# Patient Record
Sex: Female | Born: 1995 | Hispanic: Yes | Marital: Single | Smoking: Never smoker
Health system: Southern US, Community
[De-identification: ages and names within clinical notes are randomized; demographics above are authoritative.]

## PROBLEM LIST (undated history)

## (undated) ENCOUNTER — Inpatient Hospital Stay: Payer: Self-pay

## (undated) DIAGNOSIS — Z789 Other specified health status: Secondary | ICD-10-CM

## (undated) HISTORY — PX: TONSILLECTOMY: SUR1361

---

## 2017-05-24 ENCOUNTER — Other Ambulatory Visit: Payer: Self-pay

## 2017-05-24 ENCOUNTER — Encounter: Payer: Self-pay | Admitting: *Deleted

## 2017-05-24 ENCOUNTER — Emergency Department
Admission: EM | Admit: 2017-05-24 | Discharge: 2017-05-25 | Disposition: A | Discharge status: Home / Self Care | Payer: Medicaid Other | Attending: Emergency Medicine | Admitting: Emergency Medicine

## 2017-05-24 DIAGNOSIS — I880 Nonspecific mesenteric lymphadenitis: Secondary | ICD-10-CM | POA: Diagnosis not present

## 2017-05-24 DIAGNOSIS — A749 Chlamydial infection, unspecified: Secondary | ICD-10-CM | POA: Insufficient documentation

## 2017-05-24 DIAGNOSIS — R109 Unspecified abdominal pain: Secondary | ICD-10-CM | POA: Diagnosis present

## 2017-05-24 DIAGNOSIS — N760 Acute vaginitis: Secondary | ICD-10-CM | POA: Insufficient documentation

## 2017-05-24 DIAGNOSIS — B9689 Other specified bacterial agents as the cause of diseases classified elsewhere: Secondary | ICD-10-CM | POA: Diagnosis not present

## 2017-05-24 LAB — CBC
HCT: 41.4 % (ref 35.0–47.0)
Hemoglobin: 13.7 g/dL (ref 12.0–16.0)
MCH: 28.9 pg (ref 26.0–34.0)
MCHC: 33.1 g/dL (ref 32.0–36.0)
MCV: 87.3 fL (ref 80.0–100.0)
PLATELETS: 282 10*3/uL (ref 150–440)
RBC: 4.74 MIL/uL (ref 3.80–5.20)
RDW: 15.6 % — AB (ref 11.5–14.5)
WBC: 10.2 10*3/uL (ref 3.6–11.0)

## 2017-05-24 LAB — COMPREHENSIVE METABOLIC PANEL
ALBUMIN: 4.6 g/dL (ref 3.5–5.0)
ALK PHOS: 80 U/L (ref 38–126)
ALT: 18 U/L (ref 14–54)
AST: 22 U/L (ref 15–41)
Anion gap: 11 (ref 5–15)
BILIRUBIN TOTAL: 0.2 mg/dL — AB (ref 0.3–1.2)
BUN: 13 mg/dL (ref 6–20)
CALCIUM: 9.7 mg/dL (ref 8.9–10.3)
CO2: 24 mmol/L (ref 22–32)
Chloride: 103 mmol/L (ref 101–111)
Creatinine, Ser: 0.67 mg/dL (ref 0.44–1.00)
GFR calc Af Amer: >60 mL/min (ref >60)
GLUCOSE: 107 mg/dL — AB (ref 65–99)
Potassium: 3.8 mmol/L (ref 3.5–5.1)
Sodium: 138 mmol/L (ref 135–145)
TOTAL PROTEIN: 8.4 g/dL — AB (ref 6.5–8.1)

## 2017-05-24 LAB — URINALYSIS, COMPLETE (UACMP) WITH MICROSCOPIC
BILIRUBIN URINE: NEGATIVE
Bacteria, UA: NONE SEEN
GLUCOSE, UA: NEGATIVE mg/dL
HGB URINE DIPSTICK: NEGATIVE
KETONES UR: NEGATIVE mg/dL
NITRITE: NEGATIVE
PH: 5 (ref 5.0–8.0)
Protein, ur: NEGATIVE mg/dL
SPECIFIC GRAVITY, URINE: 1.008 (ref 1.005–1.030)

## 2017-05-24 LAB — POCT PREGNANCY, URINE: Preg Test, Ur: NEGATIVE

## 2017-05-24 LAB — LIPASE, BLOOD: Lipase: 24 U/L (ref 11–51)

## 2017-05-24 MED ORDER — MORPHINE SULFATE (PF) 4 MG/ML IV SOLN
4.0000 mg | Freq: Once | INTRAVENOUS | Status: AC
  Administered 2017-05-24: 4 mg via INTRAVENOUS
  Filled 2017-05-24: qty 1

## 2017-05-24 MED ORDER — ONDANSETRON HCL 4 MG/2ML IJ SOLN
4.0000 mg | Freq: Once | INTRAMUSCULAR | Status: AC
  Administered 2017-05-24: 4 mg via INTRAVENOUS
  Filled 2017-05-24: qty 2

## 2017-05-24 MED ORDER — SODIUM CHLORIDE 0.9 % IV BOLUS (SEPSIS)
1000.0000 mL | Freq: Once | INTRAVENOUS | Status: AC
  Administered 2017-05-24: 1000 mL via INTRAVENOUS

## 2017-05-24 MED ORDER — KETOROLAC TROMETHAMINE 30 MG/ML IJ SOLN
15.0000 mg | Freq: Once | INTRAMUSCULAR | Status: AC
  Administered 2017-05-24: 15 mg via INTRAVENOUS
  Filled 2017-05-24: qty 1

## 2017-05-24 NOTE — ED Triage Notes (Signed)
Pt to ED reporting sudden onset of lower and pain today with nausea and vomiting. Pt reports vaginal pain. No bleeding at this time. No diarrhea or fevers reported.

## 2017-05-24 NOTE — ED Provider Notes (Signed)
[facility] Emergency Department Provider Note  ____________________________________________   First MD Initiated Contact with Patient 05/24/17 2328     (approximate)  I have reviewed the triage vital signs and the nursing notes.   HISTORY  Chief Complaint Abdominal Pain   HPI Shelby Chavez is a 21 y.o. female who self presents to the emergency department with severe sudden onset lower abdominal pain nausea and vomiting that began several hours prior to arrival.  Pain began suddenly and has been constant ever since.  Nothing makes it better or worse.  She is also noted 3 days of a "bump" inside her vagina.  No fevers or chills.  No back pain.  No dysuria frequency or hesitancy.  She is sexually monogamous with her husband.  History reviewed. No pertinent past medical history.  There are no active problems to display for this patient.   History reviewed. No pertinent surgical history.  Prior to Admission medications   Medication Sig Start Date End Date Taking? Authorizing Provider  metroNIDAZOLE (FLAGYL) 500 MG tablet Take 1 tablet (500 mg total) 2 (two) times daily for 7 days by mouth. 05/25/17 06/01/17  Merrily Brittleifenbark, Stephaine Breshears, MD    Allergies Patient has no allergy information on record.  History reviewed. No pertinent family history.  Social History Social History   Tobacco Use  . Smoking status: Never Smoker  . Smokeless tobacco: Never Used  Substance Use Topics  . Alcohol use: No    Frequency: Never  . Drug use: Not on file    Review of Systems Constitutional: No fever/chills Eyes: No visual changes. ENT: No sore throat. Cardiovascular: Denies chest pain. Respiratory: Denies shortness of breath. Gastrointestinal: Positive for abdominal pain.  Positive for nausea, positive for vomiting.  No diarrhea.  No constipation. Genitourinary: Negative for dysuria. Musculoskeletal: Negative for back pain. Skin: Negative for rash. Neurological:  Negative for headaches, focal weakness or numbness.   ____________________________________________   PHYSICAL EXAM:  VITAL SIGNS: ED Triage Vitals [05/24/17 2127]  Enc Vitals Group     BP 133/82     Pulse Rate 99     Resp 18     Temp 98.9 F (37.2 C)     Temp Source Oral     SpO2 100 %     Weight 170 lb (77.1 kg)     Height 5\' 8"  (1.727 m)     Head Circumference      Peak Flow      Pain Score 8     Pain Loc      Pain Edu?      Excl. in GC?     Constitutional: Alert and oriented x4 appears somewhat uncomfortable nontoxic no diaphoresis speaks in full clear sentences Eyes: PERRL EOMI. Head: Atraumatic. Nose: No congestion/rhinnorhea. Mouth/Throat: No trismus Neck: No stridor.   Cardiovascular: Normal rate, regular rhythm. Grossly normal heart sounds.  Good peripheral circulation. Respiratory: Normal respiratory effort.  No retractions. Lungs CTAB and moving good air Gastrointestinal: Soft nondistended she is tender in her right greater than left lower quadrants with tenderness over McBurney's although no rebound or guarding and no frank peritonitis Pelvic exam chaperoned by female nurse Sherie: Normal external exam.  Copious amounts of green discharge coming from cervical eyes.  No cervical motion tenderness or adnexal tenderness Musculoskeletal: No lower extremity edema   Neurologic:  Normal speech and language. No gross focal neurologic deficits are appreciated. Skin:  Skin is warm, dry and intact. No rash noted. Psychiatric:  Mood and affect are normal. Speech and behavior are normal.    ____________________________________________   DIFFERENTIAL includes but not limited to  Appendicitis, diverticulitis, pelvic inflammatory disease, urinary tract infection ____________________________________________   LABS (all labs ordered are listed, but only abnormal results are displayed)  Labs Reviewed  CHLAMYDIA/NGC RT PCR ([facility] ONLY) - Abnormal; Notable for  the following components:      Result Value   Chlamydia Tr DETECTED (*)    All other components within normal limits  WET PREP, GENITAL - Abnormal; Notable for the following components:   Clue Cells Wet Prep HPF POC PRESENT (*)    WBC, Wet Prep HPF POC MANY (*)    All other components within normal limits  COMPREHENSIVE METABOLIC PANEL - Abnormal; Notable for the following components:   Glucose, Bld 107 (*)    Total Protein 8.4 (*)    Total Bilirubin 0.2 (*)    All other components within normal limits  CBC - Abnormal; Notable for the following components:   RDW 15.6 (*)    All other components within normal limits  URINALYSIS, COMPLETE (UACMP) WITH MICROSCOPIC - Abnormal; Notable for the following components:   Color, Urine STRAW (*)    APPearance HAZY (*)    Leukocytes, UA MODERATE (*)    Squamous Epithelial / LPF 0-5 (*)    All other components within normal limits  LIPASE, BLOOD  POCT PREGNANCY, URINE    Blood work reviewed and interpreted by me is positive for chlamydia as well as clue cells concerning for bacterial vaginosis __________________________________________  EKG   ____________________________________________  RADIOLOGY  CT scan abdomen pelvis reviewed by me shows nonspecific swelling in the mesentery with normal appendix. ____________________________________________   PROCEDURES  Procedure(s) performed: no  Procedures  Critical Care performed: no  Observation: no ____________________________________________   INITIAL IMPRESSION / ASSESSMENT AND PLAN / ED COURSE  Pertinent labs & imaging results that were available during my care of the patient were reviewed by me and considered in my medical decision making (see chart for details).  On arrival the patient is somewhat uncomfortable appearing with a tender abdomen.  She also reports a "ball" in her vagina for the past several days.  I think these are likely unrelated she very well may have a  Bartholin cyst versus abscess in addition to intra-abdominal pathology.  CT scan is pending.     ----------------------------------------- 2:51 AM on 05/25/2017 -----------------------------------------  The patient's abdominal pain is improved and she is able to eat and drink.  CT scan is negative for acute infectious etiology and she appears to have mesenteric adenitis.  Her pelvic exam was positive for bacterial vaginosis will treat her with Flagyl for 1 week.  GC CT is pending.  Offered the patient presumptive treatment for gonorrhea and chlamydia now versus call back with the results of her testing and she opted to be call back in the morning.  ----------------------------------------- 6:43 AM on 05/25/2017 -----------------------------------------  The patient's chlamydia test is back positive.  I have personally called the patient back twice however received no answer and she is not set up her voicemail box.  He did pass on the information to charge nurse in the morning to have her call back. ____________________________________________   FINAL CLINICAL IMPRESSION(S) / ED DIAGNOSES  Final diagnoses:  Mesenteric adenitis  Bacterial vaginosis  Chlamydia      NEW MEDICATIONS STARTED DURING THIS VISIT:  This SmartLink is deprecated. Use AVSMEDLIST instead to display the medication list  for a patient.   Note:  This document was prepared using Dragon voice recognition software and may include unintentional dictation errors.     Merrily Brittleifenbark, Cheng Dec, MD 05/25/17 [phone]

## 2017-05-25 ENCOUNTER — Encounter: Payer: Self-pay | Admitting: Radiology

## 2017-05-25 ENCOUNTER — Emergency Department: Payer: Medicaid Other

## 2017-05-25 LAB — WET PREP, GENITAL
Sperm: NONE SEEN
TRICH WET PREP: NONE SEEN
YEAST WET PREP: NONE SEEN

## 2017-05-25 LAB — CHLAMYDIA/NGC RT PCR (ARMC ONLY)
CHLAMYDIA TR: DETECTED — AB
N gonorrhoeae: NOT DETECTED

## 2017-05-25 MED ORDER — IOPAMIDOL (ISOVUE-300) INJECTION 61%
100.0000 mL | Freq: Once | INTRAVENOUS | Status: AC | PRN
  Administered 2017-05-25: 100 mL via INTRAVENOUS

## 2017-05-25 MED ORDER — METRONIDAZOLE 500 MG PO TABS: 500.0000 mg | ORAL_TABLET | Freq: Two times a day (BID) | ORAL | 0 refills | Status: AC

## 2017-05-25 NOTE — ED Notes (Signed)
Patient transported to CT 

## 2017-05-25 NOTE — ED Notes (Signed)
Pt back from CT

## 2017-05-25 NOTE — Discharge Instructions (Signed)
Please take all of your antibiotics as prescribed and follow-up with primary care as needed for recheck.  Return to the emergency department sooner for any concerns.  It was a pleasure to take care of you today, and thank you for coming to our emergency department.  If you have any questions or concerns before leaving please ask the nurse to grab me and I'm more than happy to go through your aftercare instructions again.  If you were prescribed any opioid pain medication today such as Norco, Vicodin, Percocet, morphine, hydrocodone, or oxycodone please make sure you do not drive when you are taking this medication as it can alter your ability to drive safely.  If you have any concerns once you are home that you are not improving or are in fact getting worse before you can make it to your follow-up appointment, please do not hesitate to call 911 and come back for further evaluation.  Merrily BrittleNeil Alvena Kiernan, MD  Results for orders placed or performed during the hospital encounter of 05/24/17  Wet prep, genital  Result Value Ref Range   Yeast Wet Prep HPF POC NONE SEEN NONE SEEN   Trich, Wet Prep NONE SEEN NONE SEEN   Clue Cells Wet Prep HPF POC PRESENT (A) NONE SEEN   WBC, Wet Prep HPF POC MANY (A) NONE SEEN   Sperm NONE SEEN   Lipase, blood  Result Value Ref Range   Lipase 24 11 - 51 U/L  Comprehensive metabolic panel  Result Value Ref Range   Sodium 138 135 - 145 mmol/L   Potassium 3.8 3.5 - 5.1 mmol/L   Chloride 103 101 - 111 mmol/L   CO2 24 22 - 32 mmol/L   Glucose, Bld 107 (H) 65 - 99 mg/dL   BUN 13 6 - 20 mg/dL   Creatinine, Ser 4.090.67 0.44 - 1.00 mg/dL   Calcium 9.7 8.9 - 81.110.3 mg/dL   Total Protein 8.4 (H) 6.5 - 8.1 g/dL   Albumin 4.6 3.5 - 5.0 g/dL   AST 22 15 - 41 U/L   ALT 18 14 - 54 U/L   Alkaline Phosphatase 80 38 - 126 U/L   Total Bilirubin 0.2 (L) 0.3 - 1.2 mg/dL   GFR calc non Af Amer >60 >60 mL/min   GFR calc Af Amer >60 >60 mL/min   Anion gap 11 5 - 15  CBC  Result Value  Ref Range   WBC 10.2 3.6 - 11.0 K/uL   RBC 4.74 3.80 - 5.20 MIL/uL   Hemoglobin 13.7 12.0 - 16.0 g/dL   HCT 91.441.4 78.235.0 - 95.647.0 %   MCV 87.3 80.0 - 100.0 fL   MCH 28.9 26.0 - 34.0 pg   MCHC 33.1 32.0 - 36.0 g/dL   RDW 21.315.6 (H) 08.611.5 - 57.814.5 %   Platelets 282 150 - 440 K/uL  Urinalysis, Complete w Microscopic  Result Value Ref Range   Color, Urine STRAW (A) YELLOW   APPearance HAZY (A) CLEAR   Specific Gravity, Urine 1.008 1.005 - 1.030   pH 5.0 5.0 - 8.0   Glucose, UA NEGATIVE NEGATIVE mg/dL   Hgb urine dipstick NEGATIVE NEGATIVE   Bilirubin Urine NEGATIVE NEGATIVE   Ketones, ur NEGATIVE NEGATIVE mg/dL   Protein, ur NEGATIVE NEGATIVE mg/dL   Nitrite NEGATIVE NEGATIVE   Leukocytes, UA MODERATE (A) NEGATIVE   RBC / HPF 6-30 0 - 5 RBC/hpf   WBC, UA 0-5 0 - 5 WBC/hpf   Bacteria, UA NONE SEEN NONE SEEN  Squamous Epithelial / LPF 0-5 (A) NONE SEEN  Pregnancy, urine POC  Result Value Ref Range   Preg Test, Ur NEGATIVE NEGATIVE   Ct Abdomen Pelvis W Contrast  Result Date: 05/25/2017 CLINICAL DATA:  Abdominal pain with nausea and vomiting EXAM: CT ABDOMEN AND PELVIS WITH CONTRAST TECHNIQUE: Multidetector CT imaging of the abdomen and pelvis was performed using the standard protocol following bolus administration of intravenous contrast. CONTRAST:  100mL ISOVUE-300 IOPAMIDOL (ISOVUE-300) INJECTION 61% COMPARISON:  None. FINDINGS: Lower chest: Lung bases demonstrate no acute consolidation or effusion. Normal heart size. Hepatobiliary: No focal liver abnormality is seen. No gallstones, gallbladder wall thickening, or biliary dilatation. Pancreas: Unremarkable. No pancreatic ductal dilatation or surrounding inflammatory changes. Spleen: Normal in size without focal abnormality. Adrenals/Urinary Tract: Adrenal glands are unremarkable. Kidneys are normal, without renal calculi, focal lesion, or hydronephrosis. Bladder is unremarkable. Stomach/Bowel: Stomach is within normal limits. Appendix appears  normal. No evidence of bowel wall thickening, distention, or inflammatory changes. Vascular/Lymphatic: No significant vascular findings are present. No enlarged abdominal or pelvic lymph nodes. Reproductive: Uterus and bilateral adnexa are unremarkable. Other: Small free fluid in the pelvis. No free air. Increased hazy density within the anterior mesentry. Musculoskeletal: No acute or significant osseous findings. IMPRESSION: 1. Mild hazy density within the anterior mesenteric fat, this is of uncertain significance and etiology and could relate to nonspecific mesenteric edema or inflammation. No adjacent colon wall thickening. Negative for appendicitis. 2. Small amount of free fluid in the pelvis. Electronically Signed   By: Jasmine PangKim  Fujinaga M.D.   On: 05/25/2017 00:57

## 2017-05-27 ENCOUNTER — Telehealth: Payer: Self-pay | Admitting: Emergency Medicine

## 2017-05-27 NOTE — Telephone Encounter (Addendum)
Called patient to inform of positive chlamydia test and need for treatment.  Per dr Lamont Snowballrifenbark call in doxycycline 100 mg twice daily for 10 days.  Phone is not taking calls right now.  I have been unable to reach patient.  Her phone continues to say it is not taking calls.  I will send a letter asking patient to call me.

## 2017-06-12 ENCOUNTER — Other Ambulatory Visit: Payer: Self-pay | Admitting: Family Medicine

## 2017-06-12 DIAGNOSIS — Z3481 Encounter for supervision of other normal pregnancy, first trimester: Secondary | ICD-10-CM

## 2017-06-12 DIAGNOSIS — Z30431 Encounter for routine checking of intrauterine contraceptive device: Secondary | ICD-10-CM

## 2017-06-17 ENCOUNTER — Ambulatory Visit
Admission: RE | Admit: 2017-06-17 | Discharge: 2017-06-17 | Disposition: A | Discharge status: Home / Self Care | Payer: Medicaid Other | Source: Ambulatory Visit | Attending: Family Medicine | Admitting: Family Medicine

## 2017-06-17 DIAGNOSIS — Z30431 Encounter for routine checking of intrauterine contraceptive device: Secondary | ICD-10-CM | POA: Insufficient documentation

## 2017-06-17 DIAGNOSIS — Z3481 Encounter for supervision of other normal pregnancy, first trimester: Secondary | ICD-10-CM

## 2017-06-17 DIAGNOSIS — O4691 Antepartum hemorrhage, unspecified, first trimester: Secondary | ICD-10-CM | POA: Diagnosis not present

## 2017-06-17 DIAGNOSIS — Z3A01 Less than 8 weeks gestation of pregnancy: Secondary | ICD-10-CM | POA: Diagnosis not present

## 2017-07-25 ENCOUNTER — Emergency Department
Admission: EM | Admit: 2017-07-25 | Discharge: 2017-07-26 | Disposition: A | Discharge status: Home / Self Care | Payer: Medicaid Other | Attending: Emergency Medicine | Admitting: Emergency Medicine

## 2017-07-25 ENCOUNTER — Other Ambulatory Visit: Payer: Self-pay

## 2017-07-25 ENCOUNTER — Encounter: Payer: Self-pay | Admitting: Emergency Medicine

## 2017-07-25 DIAGNOSIS — R102 Pelvic and perineal pain: Secondary | ICD-10-CM | POA: Diagnosis not present

## 2017-07-25 DIAGNOSIS — O99341 Other mental disorders complicating pregnancy, first trimester: Secondary | ICD-10-CM | POA: Diagnosis not present

## 2017-07-25 DIAGNOSIS — E876 Hypokalemia: Secondary | ICD-10-CM | POA: Diagnosis not present

## 2017-07-25 DIAGNOSIS — Z3A11 11 weeks gestation of pregnancy: Secondary | ICD-10-CM | POA: Insufficient documentation

## 2017-07-25 DIAGNOSIS — O9989 Other specified diseases and conditions complicating pregnancy, childbirth and the puerperium: Secondary | ICD-10-CM | POA: Diagnosis present

## 2017-07-25 DIAGNOSIS — Z3490 Encounter for supervision of normal pregnancy, unspecified, unspecified trimester: Secondary | ICD-10-CM

## 2017-07-25 DIAGNOSIS — O99281 Endocrine, nutritional and metabolic diseases complicating pregnancy, first trimester: Secondary | ICD-10-CM | POA: Diagnosis not present

## 2017-07-25 DIAGNOSIS — F419 Anxiety disorder, unspecified: Secondary | ICD-10-CM

## 2017-07-25 DIAGNOSIS — F410 Panic disorder [episodic paroxysmal anxiety] without agoraphobia: Secondary | ICD-10-CM

## 2017-07-25 LAB — COMPREHENSIVE METABOLIC PANEL
ALK PHOS: 71 U/L (ref 38–126)
ALT: 12 U/L — ABNORMAL LOW (ref 14–54)
ANION GAP: 9 (ref 5–15)
AST: 21 U/L (ref 15–41)
Albumin: 3.8 g/dL (ref 3.5–5.0)
BUN: 6 mg/dL (ref 6–20)
CALCIUM: 8.7 mg/dL — AB (ref 8.9–10.3)
CO2: 24 mmol/L (ref 22–32)
Chloride: 102 mmol/L (ref 101–111)
Creatinine, Ser: 0.56 mg/dL (ref 0.44–1.00)
Glucose, Bld: 125 mg/dL — ABNORMAL HIGH (ref 65–99)
Potassium: 3.3 mmol/L — ABNORMAL LOW (ref 3.5–5.1)
Sodium: 135 mmol/L (ref 135–145)
TOTAL PROTEIN: 7.2 g/dL (ref 6.5–8.1)
Total Bilirubin: <0.1 mg/dL — ABNORMAL LOW (ref 0.3–1.2)

## 2017-07-25 LAB — CBC
HCT: 38.6 % (ref 35.0–47.0)
HEMOGLOBIN: 13.2 g/dL (ref 12.0–16.0)
MCH: 29 pg (ref 26.0–34.0)
MCHC: 34.3 g/dL (ref 32.0–36.0)
MCV: 84.8 fL (ref 80.0–100.0)
PLATELETS: 233 10*3/uL (ref 150–440)
RBC: 4.55 MIL/uL (ref 3.80–5.20)
RDW: 13 % (ref 11.5–14.5)
WBC: 10.4 10*3/uL (ref 3.6–11.0)

## 2017-07-25 LAB — LIPASE, BLOOD: Lipase: 25 U/L (ref 11–51)

## 2017-07-25 LAB — URINALYSIS, COMPLETE (UACMP) WITH MICROSCOPIC
BILIRUBIN URINE: NEGATIVE
GLUCOSE, UA: NEGATIVE mg/dL
HGB URINE DIPSTICK: NEGATIVE
KETONES UR: NEGATIVE mg/dL
LEUKOCYTES UA: NEGATIVE
NITRITE: NEGATIVE
PROTEIN: NEGATIVE mg/dL
Specific Gravity, Urine: 1.015 (ref 1.005–1.030)
pH: 7 (ref 5.0–8.0)

## 2017-07-25 LAB — HCG, QUANTITATIVE, PREGNANCY: HCG, BETA CHAIN, QUANT, S: 38291 m[IU]/mL — AB (ref <5)

## 2017-07-25 MED ORDER — POTASSIUM CHLORIDE CRYS ER 20 MEQ PO TBCR
40.0000 meq | EXTENDED_RELEASE_TABLET | Freq: Once | ORAL | Status: AC
  Administered 2017-07-26: 40 meq via ORAL
  Filled 2017-07-25: qty 2

## 2017-07-25 NOTE — ED Provider Notes (Signed)
[facility] Emergency Department Provider Note  ____________________________________________   First MD Initiated Contact with Patient 07/25/17 2310     (approximate)  I have reviewed the triage vital signs and the nursing notes.   HISTORY  Chief Complaint Abdominal Cramping; Anxiety; and Shortness of Breath    HPI Shelby Chavez is a 22 y.o. female G4P3 at about [redacted] weeks gestation who goes to [facility] for [facility].  She presents by private vehicle for evaluation of increased anxiety and what she describes as a panic attack tonight.  She states that tonight she felt increasingly anxious about her pregnancy and worried in general.  She had an episode where she began breathing rapidly and had some generalized cramping as well as some cramping in her lower abdomen.  The episode lasted about 30 minutes and she has been feeling much better for the 4-1/2 hours she has been waiting in the emergency department.  He was ill last week and states that she tested positive for fluids of but she is feeling much better now.  Since being treated, she denies any cough, fever/chills, nausea//vomiting.  She has had no vaginal bleeding and no dysuria.  She had shortness of breath associated with the hyperventilation and some generalized muscle aches as well as the cramping in the left lower quadrant of her abdomen.  The episode was severe but the residual cramping is mild.  The episode was acute in onset.  She has no history of anxiety or panic attacks previously.  History reviewed. No pertinent past medical history.  There are no active problems to display for this patient.   No past surgical history on file.  Prior to Admission medications   Not on File    Allergies Patient has no known allergies.  No family history on file.  Social History Social History   Tobacco Use  . Smoking status: Never Smoker  . Smokeless tobacco: Never Used  Substance Use Topics    . Alcohol use: No    Frequency: Never  . Drug use: No    Review of Systems Constitutional: No fever/chills Eyes: No visual changes. ENT: No sore throat. Cardiovascular: Denies chest pain. Respiratory: Episode of shortness of breath and rapid breathing (hyperventilation), now completely resolved. Gastrointestinal: Left lower quadrant abdominal cramping.  No nausea, no vomiting.  No diarrhea.  No constipation. Genitourinary: Negative for dysuria. Musculoskeletal: Negative for neck pain.  Negative for back pain. Integumentary: Negative for rash. Neurological: Negative for headaches, focal weakness or numbness. Psych: Increased anxiety and a probable panic attack today  ____________________________________________   PHYSICAL EXAM:  VITAL SIGNS: ED Triage Vitals  Enc Vitals Group     BP 07/25/17 1822 (!) 131/55     Pulse Rate 07/25/17 1822 91     Resp 07/25/17 1822 18     Temp 07/25/17 1822 98.2 F (36.8 C)     Temp Source 07/25/17 1822 Oral     SpO2 07/25/17 1822 100 %     Weight 07/25/17 1822 83.5 kg (184 lb)     Height 07/25/17 1822 1.67 m (5' 5.75")     Head Circumference --      Peak Flow --      Pain Score 07/25/17 1821 5     Pain Loc --      Pain Edu? --      Excl. in GC? --     Constitutional: Alert and oriented. Well appearing and in no acute distress. Eyes:  Conjunctivae are normal.  Head: Atraumatic. Nose: No congestion/rhinnorhea. Mouth/Throat: Mucous membranes are moist. Neck: No stridor.  No meningeal signs.   Cardiovascular: Normal rate, regular rhythm. Good peripheral circulation. Grossly normal heart sounds. Respiratory: Normal respiratory effort.  No retractions. Lungs CTAB. Gastrointestinal: Soft and nontender. No distention.  Genitourinary: Deferred, no indication for pelvic exam Musculoskeletal: No lower extremity tenderness nor edema. No gross deformities of extremities. Neurologic:  Normal speech and language. No gross focal neurologic deficits  are appreciated.  Skin:  Skin is warm, dry and intact. No rash noted. Psychiatric: Mood and affect are slightly anxious, but generally normal and appropriate.  ____________________________________________   LABS (all labs ordered are listed, but only abnormal results are displayed)  Labs Reviewed  COMPREHENSIVE METABOLIC PANEL - Abnormal; Notable for the following components:      Result Value   Potassium 3.3 (*)    Glucose, Bld 125 (*)    Calcium 8.7 (*)    ALT 12 (*)    Total Bilirubin <0.1 (*)    All other components within normal limits  URINALYSIS, COMPLETE (UACMP) WITH MICROSCOPIC - Abnormal; Notable for the following components:   Squamous Epithelial / LPF 0-5 (*)    Bacteria, UA RARE (*)    All other components within normal limits  HCG, QUANTITATIVE, PREGNANCY - Abnormal; Notable for the following components:   hCG, Beta Chain, Quant, S 38,291 (*)    All other components within normal limits  LIPASE, BLOOD  CBC   ____________________________________________  EKG  ED ECG REPORT I, Loleta Rose, the attending physician, personally viewed and interpreted this ECG.  Date: 07/25/2017 EKG Time: 18:27 Rate: 95 Rhythm: normal sinus rhythm QRS Axis: normal Intervals: normal ST/T Wave abnormalities: normal Narrative Interpretation: no evidence of acute ischemia  ____________________________________________  RADIOLOGY   No results found.  ____________________________________________   PROCEDURES  Critical Care performed: No   Procedure(s) performed:   Procedures   ____________________________________________   INITIAL IMPRESSION / ASSESSMENT AND PLAN / ED COURSE  As part of my medical decision making, I reviewed the following data within the electronic MEDICAL RECORD NUMBER Nursing notes reviewed and incorporated, Labs reviewed , EKG interpreted , Old chart reviewed and Notes from prior ED visits  Differential diagnosis includes but is not limited to  anxiety/panic attack, pulmonary embolism, bacterial infection/nonspecific infectious process, asthma attack, etc.   The patient is well-appearing in no acute distress with normal vital signs.  Her lab work is all reassuring except for a slightly low potassium.  I will give her a potassium supplement and some information about potassium rich foods but it is not clinically relevant at this time.  Her EKG is normal.  Urinalysis is still pending.  I performed a bedside transabdominal ultrasound and was able to visualize a fetus with normal cardiac activity.  I did not visualize any gross abnormalities that would necessitate an "official" ultrasound.  She feels better after her time in the emergency department, is currently asymptomatic except for some residual mild intermittent crampy pain in her left lower abdomen, and she is reassured by the bedside ultrasound.  We will await the results of the urinalysis but she is comfortable with following up with [facility].  I explained that typically I do not start people on benzodiazepines or other medication for anxiety in the emergency department and particularly not pregnant patients, and she is comfortable with following up as an outpatient.  Gave my usual and customary return precautions.  Clinical Course as  of Jul 26 212  Fri Jul 26, 2017  0002 UA reassuring with rare bacteria but 0-5 WBCs and clinically no signs/symptoms of UTI.  will not treat empirically.    [CF]    Clinical Course User Index [CF] Loleta RoseForbach, Jashad Depaula, MD    ____________________________________________  FINAL CLINICAL IMPRESSION(S) / ED DIAGNOSES  Final diagnoses:  Anxiety  Panic attack  Pregnancy at early stage  Hypokalemia     MEDICATIONS GIVEN DURING THIS VISIT:  Medications  potassium chloride SA (K-DUR,KLOR-CON) CR tablet 40 mEq (40 mEq Oral Given 07/26/17 0030)     ED Discharge Orders    None       Note:  This document was prepared using Dragon voice recognition  software and may include unintentional dictation errors.    Loleta RoseForbach, Tramar Brueckner, MD 07/26/17 [phone]

## 2017-07-25 NOTE — ED Triage Notes (Addendum)
Pt presents to ED, states she is [redacted] weeks pregnant. Pt states is a G4L3. Pt states that she has had increased anxiety at home that started earlier today. Pt states sudden onset sweating and "hard to breathe". Pt states had a panic attack. Pt states continued anxiety and shortness of breath at this time. Pt also c/o lower abdominal cramping, denies any vaginal bleeding at this time.

## 2017-07-25 NOTE — ED Notes (Signed)
Pt to the er for anxiety, panic attack. Pt says she was worried about her pregnancy and thinks maybe she was worried about the pregnancy. Pt reports some lower abdominal pain. Pt denies bleeding. VSS. Pt is in no distress. Dr York CeriseForbach at the bedside.

## 2017-07-25 NOTE — Discharge Instructions (Signed)
You have been seen in the Emergency Department (ED) today for a combination of symptoms that sound like anxiety and a panic attack.  As we have discussed today?s test results are normal, including a reassuring bedside ultrasound which showed your baby and a good strong heartbeat.  Please follow up with the recommended doctor as instructed above in these documents regarding today?s emergent visit and your recent symptoms to discuss further management.  Continue to take your regular medications including prenatal vitamins.  Read through the information provided about the potassium content of foods; your potassium level was a little low tonight, and even though that is not the cause of your symptoms, it makes sense to try and get a little more potassium in your diet.  Return to the Emergency Department (ED) if you experience any further chest pain/pressure/tightness, difficulty breathing, vaginal bleeding, or other symptoms that concern you.

## 2017-08-15 ENCOUNTER — Other Ambulatory Visit: Payer: Self-pay | Admitting: Family Medicine

## 2017-08-15 DIAGNOSIS — Z3481 Encounter for supervision of other normal pregnancy, first trimester: Secondary | ICD-10-CM

## 2017-09-17 ENCOUNTER — Ambulatory Visit
Admission: RE | Admit: 2017-09-17 | Discharge: 2017-09-17 | Disposition: A | Discharge status: Home / Self Care | Payer: Medicaid Other | Source: Ambulatory Visit | Attending: Family Medicine | Admitting: Family Medicine

## 2017-09-17 DIAGNOSIS — Z3482 Encounter for supervision of other normal pregnancy, second trimester: Secondary | ICD-10-CM | POA: Insufficient documentation

## 2017-09-17 DIAGNOSIS — Z3A19 19 weeks gestation of pregnancy: Secondary | ICD-10-CM | POA: Insufficient documentation

## 2017-09-17 DIAGNOSIS — Z3481 Encounter for supervision of other normal pregnancy, first trimester: Secondary | ICD-10-CM

## 2017-12-12 ENCOUNTER — Observation Stay
Admission: EM | Admit: 2017-12-12 | Discharge: 2017-12-13 | Disposition: A | Discharge status: Home / Self Care | Payer: Medicaid Other | Attending: Obstetrics and Gynecology | Admitting: Obstetrics and Gynecology

## 2017-12-12 ENCOUNTER — Other Ambulatory Visit: Payer: Self-pay

## 2017-12-12 DIAGNOSIS — O4703 False labor before 37 completed weeks of gestation, third trimester: Secondary | ICD-10-CM | POA: Diagnosis not present

## 2017-12-12 DIAGNOSIS — O26893 Other specified pregnancy related conditions, third trimester: Secondary | ICD-10-CM | POA: Insufficient documentation

## 2017-12-12 DIAGNOSIS — Z3A31 31 weeks gestation of pregnancy: Secondary | ICD-10-CM | POA: Diagnosis not present

## 2017-12-12 DIAGNOSIS — Z3490 Encounter for supervision of normal pregnancy, unspecified, unspecified trimester: Secondary | ICD-10-CM

## 2017-12-12 DIAGNOSIS — R102 Pelvic and perineal pain: Secondary | ICD-10-CM | POA: Diagnosis not present

## 2017-12-12 HISTORY — DX: Other specified health status: Z78.9

## 2017-12-12 NOTE — OB Triage Note (Signed)
Pt additionally states pelvic pressure.

## 2017-12-12 NOTE — OB Triage Note (Signed)
Pt arrival to triage with c/o losing her mucous plug around 2130.  Pt denies vaginal bleeding, LOF, and contractions.  Pt is feeling baby move.  EFM and toco applied and tracing.

## 2017-12-12 NOTE — Progress Notes (Signed)
At 2246 Interpreter on a stick used for triage assessment.  Jacquenette Shone #[id number] interpreted.

## 2017-12-13 DIAGNOSIS — O4703 False labor before 37 completed weeks of gestation, third trimester: Secondary | ICD-10-CM | POA: Diagnosis not present

## 2017-12-13 DIAGNOSIS — Z3490 Encounter for supervision of normal pregnancy, unspecified, unspecified trimester: Secondary | ICD-10-CM

## 2017-12-13 LAB — URINALYSIS, ROUTINE W REFLEX MICROSCOPIC
BILIRUBIN URINE: NEGATIVE
Glucose, UA: NEGATIVE mg/dL
Hgb urine dipstick: NEGATIVE
KETONES UR: NEGATIVE mg/dL
LEUKOCYTES UA: NEGATIVE
NITRITE: NEGATIVE
PROTEIN: NEGATIVE mg/dL
Specific Gravity, Urine: 1.004 — ABNORMAL LOW (ref 1.005–1.030)
pH: 7 (ref 5.0–8.0)

## 2017-12-13 LAB — FETAL FIBRONECTIN: FETAL FIBRONECTIN: POSITIVE — AB

## 2017-12-13 LAB — WET PREP, GENITAL: Trich, Wet Prep: NONE SEEN

## 2017-12-13 NOTE — Discharge Summary (Signed)
TRIAGE VISIT with NST   Chera A Gildner is a 22 y.o. [id number]. She is at [redacted]w[redacted]d gestation, presenting with concerns for preterm labor with losing her mucous plug and having pelvic pressure.  Indication: Contractions  S: Resting comfortably.  NO CTX, no VB. Active fetal movement. Concerned about pelvic pressure after intercourse.  O:  BP 124/72 (BP Location: Left Arm)   Pulse 93   Temp 98.1 F (36.7 C) (Oral)   Resp 16   Ht  (1.727 m)   Wt 89.8 kg (198 lb)   LMP 04/25/2017   Breastfeeding? Unknown   BMI 30.11 kg/m  Results for orders placed or performed during the hospital encounter of 12/12/17 (from the past 48 hour(s))  Wet prep, genital   Collection Time: 12/12/17 11:43 PM  Result Value Ref Range   Yeast Wet Prep HPF POC PRESENT (A) NONE SEEN   Trich, Wet Prep NONE SEEN NONE SEEN   Clue Cells Wet Prep HPF POC PRESENT (A) NONE SEEN   WBC, Wet Prep HPF POC FEW (A) NONE SEEN   Sperm PRESENT   Fetal fibronectin   Collection Time: 12/12/17 11:43 PM  Result Value Ref Range   Fetal Fibronectin POSITIVE (A) NEGATIVE   Appearance, FETFIB CLEAR CLEAR  Urinalysis, Routine w reflex microscopic   Collection Time: 12/12/17 11:43 PM  Result Value Ref Range   Color, Urine STRAW (A) YELLOW   APPearance CLEAR (A) CLEAR   Specific Gravity, Urine 1.004 (L) 1.005 - 1.030   pH 7.0 5.0 - 8.0   Glucose, UA NEGATIVE NEGATIVE mg/dL   Hgb urine dipstick NEGATIVE NEGATIVE   Bilirubin Urine NEGATIVE NEGATIVE   Ketones, ur NEGATIVE NEGATIVE mg/dL   Protein, ur NEGATIVE NEGATIVE mg/dL   Nitrite NEGATIVE NEGATIVE   Leukocytes, UA NEGATIVE NEGATIVE     Gen: NAD, AAOx3      Abd: FNTTP      Ext: Non-tender, Nonedmeatous    NST/FHT: 125, mod var, +accels, no decels TOCO: quiet [id number]: Fingertip Exam by:: Charlane Ferretti RN  NST: Reactive. See FHT above for particulars.  A/P:  22 y.o. [id number] [redacted]w[redacted]d with concern for early labor.   Labor: not present. Cervix not dilated and no contractions  on the monitor. No evidence of UTI.  Pelvic pressure: Positive wet prep for yeast and clue cells. Will d/c home with diflucan and recommendation for metrogel tomorrow at her visit. PO hydration.   Fetal Wellbeing: NST reactive Reassuring Cat 1 tracing.  D/c home stable, precautions reviewed, follow-up as scheduled.

## 2017-12-13 NOTE — OB Triage Note (Signed)
Discharge instructions provided and reviewed.  Discussed plan to call [facility] for metronidazole prescription and to obtain over the counter monistat.  Pt verbalized understanding.

## 2018-05-05 ENCOUNTER — Encounter (HOSPITAL_COMMUNITY): Payer: Self-pay

## 2019-06-15 IMAGING — CT CT ABD-PELV W/ CM
2 of 5 series · 15 of 46 positions shown, 17 images · IV contrast (APPLIED)
Comparison: None.

CLINICAL DATA: Abdominal pain with nausea and vomiting

EXAM:
CT ABDOMEN AND PELVIS WITH CONTRAST
TECHNIQUE: Multidetector CT imaging of the abdomen and pelvis was performed
using the standard protocol following bolus administration of
intravenous contrast.
CONTRAST:  100mL 6BVUSH-N00 IOPAMIDOL (6BVUSH-N00) INJECTION 61%

[Series 2: routine abd/pel with · axial · 0.76mm/px · z∈[-1166,-716]mm · 12 of 102 slices shown, 14 images]
[im 6/102  soft-tissue]
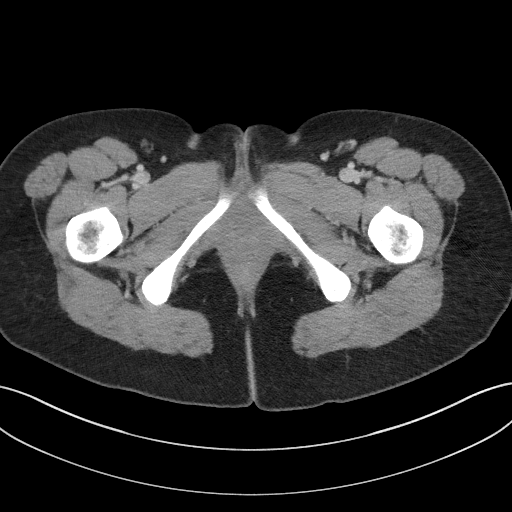
[im 6/102  bone]
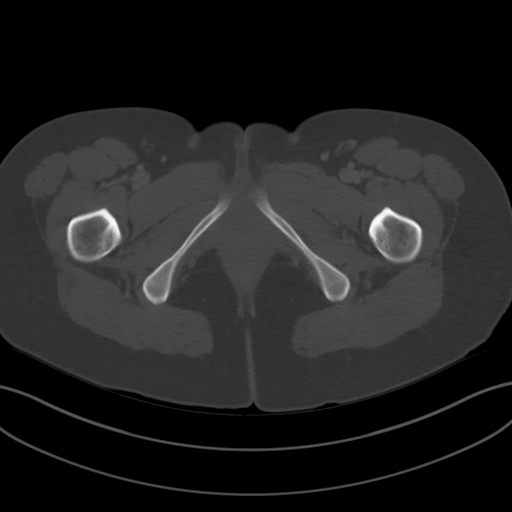
[im 16/102  soft-tissue]
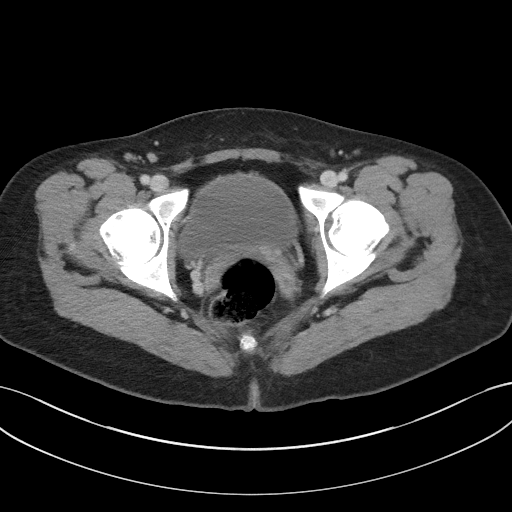
[im 22/102  soft-tissue]
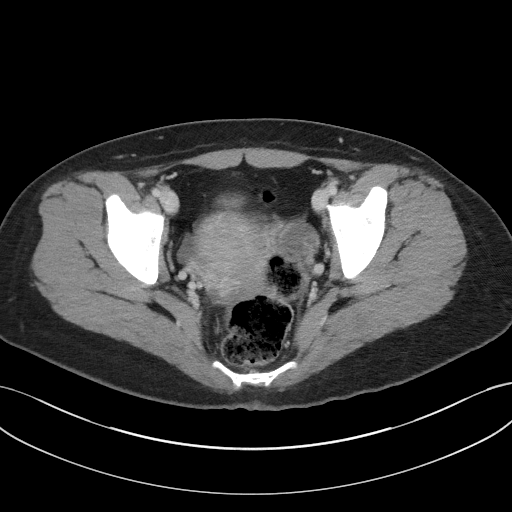
[im 32/102  soft-tissue]
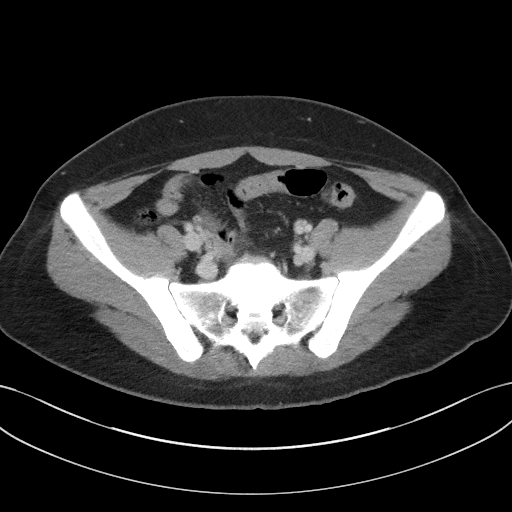
[im 38/102  soft-tissue]
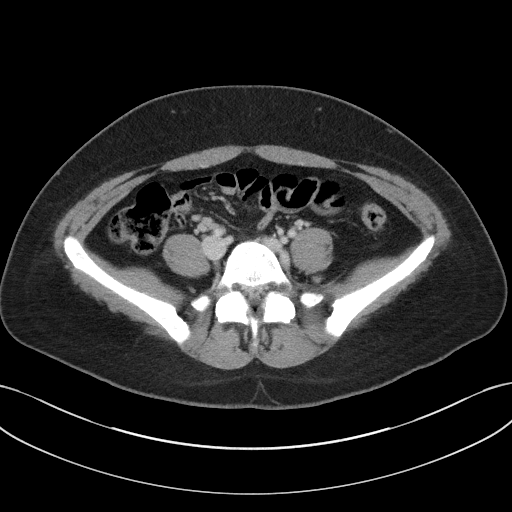
[im 48/102  soft-tissue]
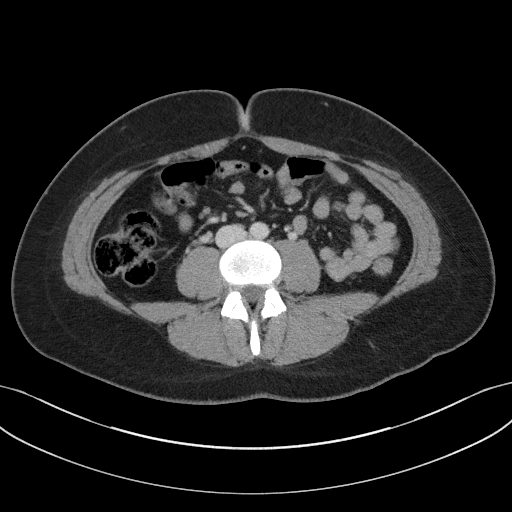
[im 54/102  soft-tissue]
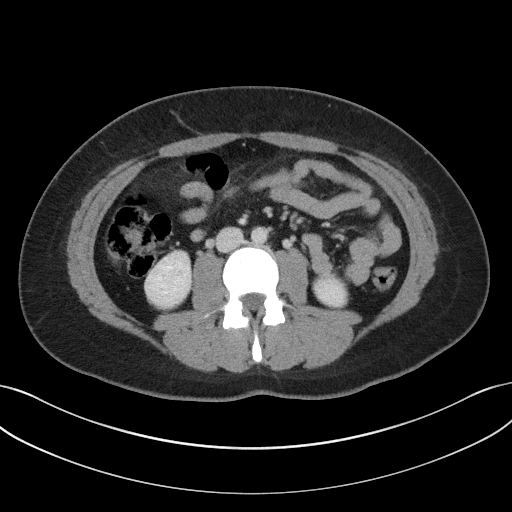
[im 64/102  soft-tissue]
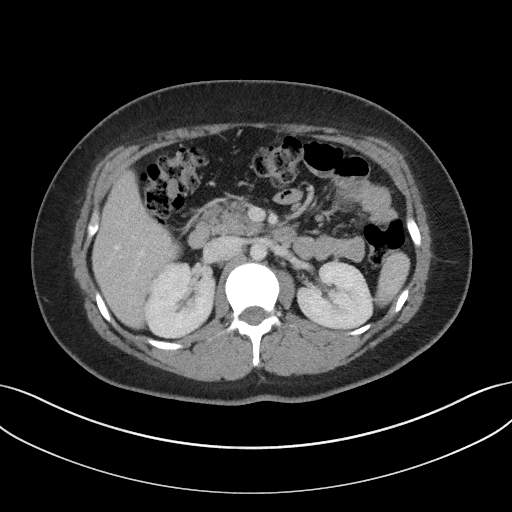
[im 70/102  soft-tissue]
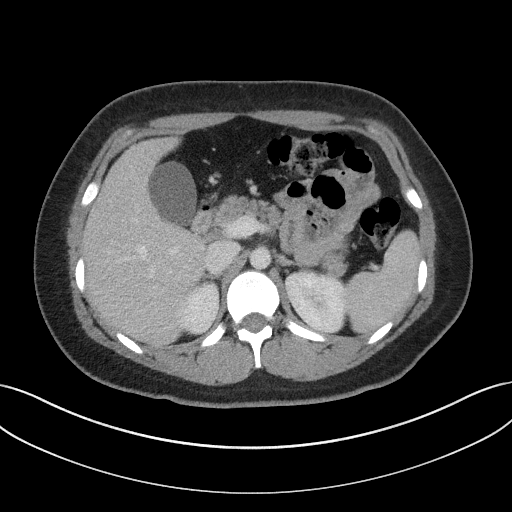
[im 70/102  bone]
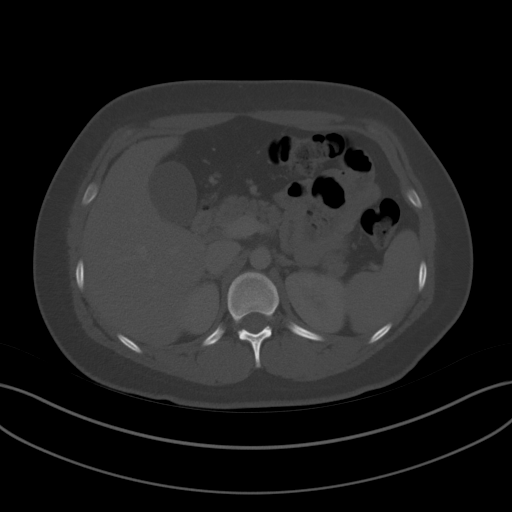
[im 80/102  soft-tissue]
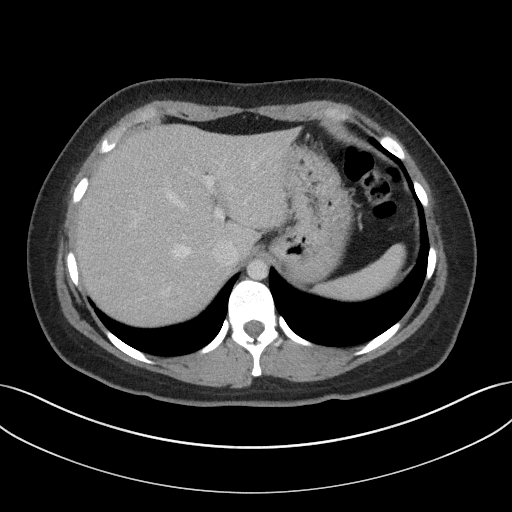
[im 86/102  soft-tissue]
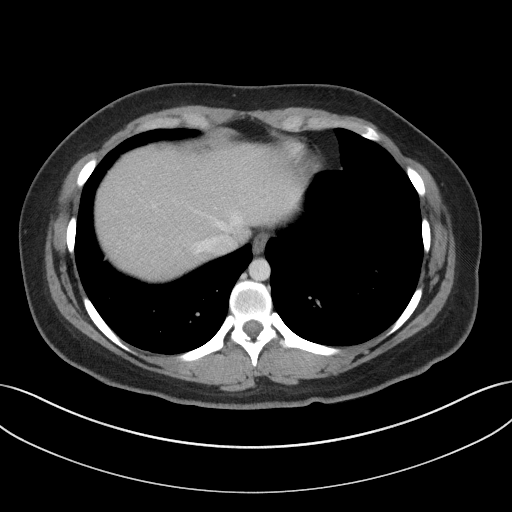
[im 96/102  soft-tissue]
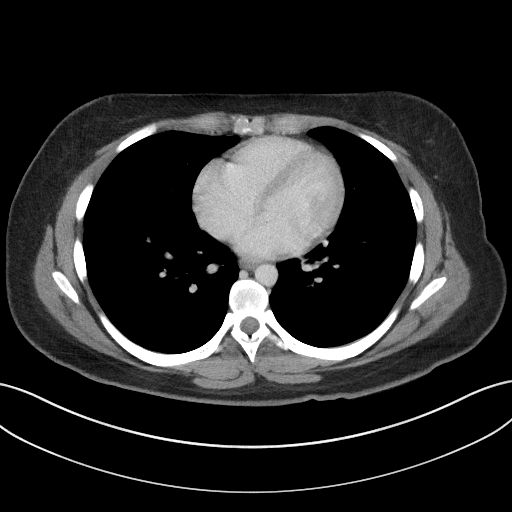

[Series 5: coronal st · coronal · 0.74mm/px · 3 of 77 slices shown]
[im 26/77  soft-tissue]
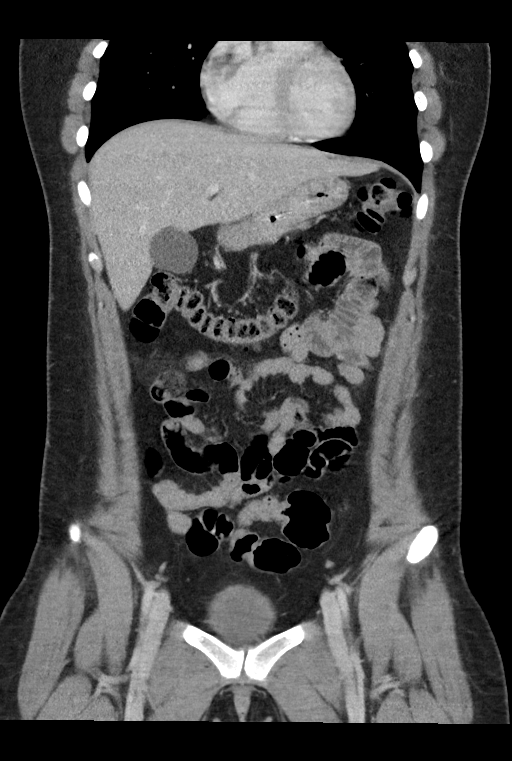
[im 34/77  soft-tissue]
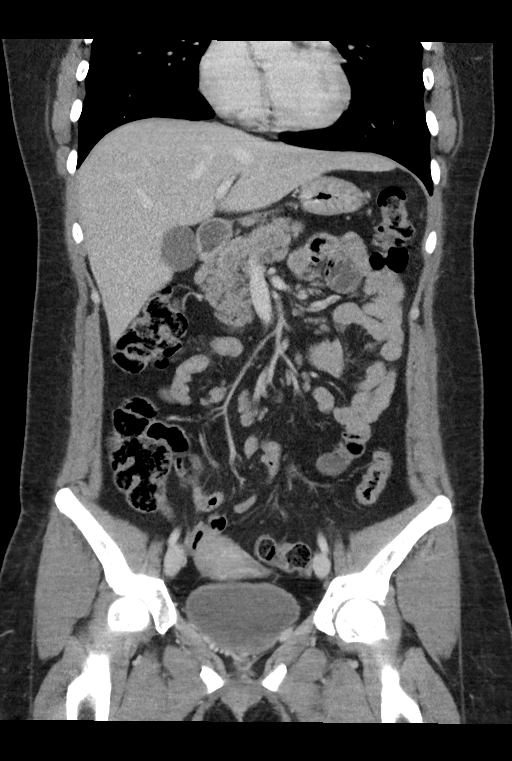
[im 43/77  soft-tissue]
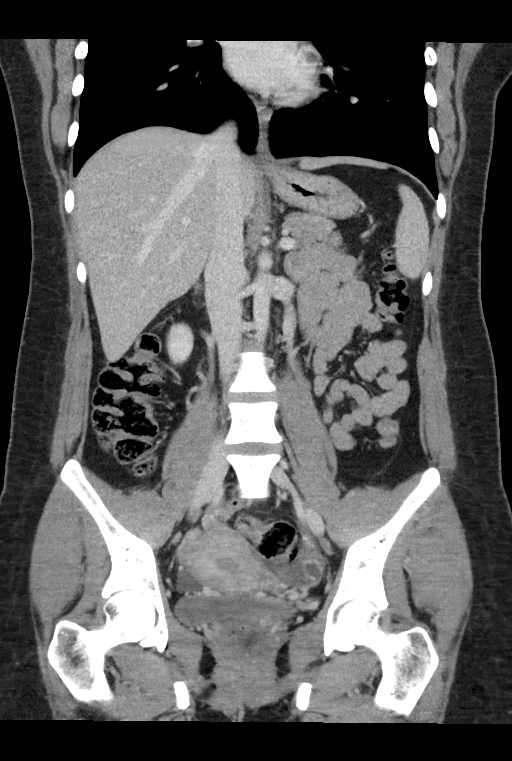

[15 of 46 positions shown; findings below may reference images not displayed]

FINDINGS: Lower chest: Lung bases demonstrate no acute consolidation or
effusion. Normal heart size.

Hepatobiliary: No focal liver abnormality is seen. No gallstones,
gallbladder wall thickening, or biliary dilatation.

Pancreas: Unremarkable. No pancreatic ductal dilatation or
surrounding inflammatory changes.

Spleen: Normal in size without focal abnormality.

Adrenals/Urinary Tract: Adrenal glands are unremarkable. Kidneys are
normal, without renal calculi, focal lesion, or hydronephrosis.
Bladder is unremarkable.

Stomach/Bowel: Stomach is within normal limits. Appendix appears
normal. No evidence of bowel wall thickening, distention, or
inflammatory changes.

Vascular/Lymphatic: No significant vascular findings are present. No
enlarged abdominal or pelvic lymph nodes.

Reproductive: Uterus and bilateral adnexa are unremarkable.

Other: Small free fluid in the pelvis. No free air. Increased hazy
density within the anterior mesentry.

Musculoskeletal: No acute or significant osseous findings.
IMPRESSION: 1. Mild hazy density within the anterior mesenteric fat, this is of
uncertain significance and etiology and could relate to nonspecific
mesenteric edema or inflammation. No adjacent colon wall thickening.
Negative for appendicitis.
2. Small amount of free fluid in the pelvis.

## 2020-12-07 ENCOUNTER — Other Ambulatory Visit: Payer: Self-pay

## 2020-12-07 ENCOUNTER — Encounter: Payer: Self-pay | Admitting: Emergency Medicine

## 2020-12-07 DIAGNOSIS — N390 Urinary tract infection, site not specified: Secondary | ICD-10-CM | POA: Insufficient documentation

## 2020-12-07 LAB — CBC WITH DIFFERENTIAL/PLATELET
Abs Immature Granulocytes: 0.02 10*3/uL (ref 0.00–0.07)
Basophils Absolute: 0.1 10*3/uL (ref 0.0–0.1)
Basophils Relative: 1 %
Eosinophils Absolute: 1.2 10*3/uL — ABNORMAL HIGH (ref 0.0–0.5)
Eosinophils Relative: 13 %
HCT: 38.2 % (ref 36.0–46.0)
Hemoglobin: 12.8 g/dL (ref 12.0–15.0)
Immature Granulocytes: 0 %
Lymphocytes Relative: 32 %
Lymphs Abs: 3 10*3/uL (ref 0.7–4.0)
MCH: 28.1 pg (ref 26.0–34.0)
MCHC: 33.5 g/dL (ref 30.0–36.0)
MCV: 84 fL (ref 80.0–100.0)
Monocytes Absolute: 0.7 10*3/uL (ref 0.1–1.0)
Monocytes Relative: 7 %
Neutro Abs: 4.5 10*3/uL (ref 1.7–7.7)
Neutrophils Relative %: 47 %
Platelets: 259 10*3/uL (ref 150–400)
RBC: 4.55 MIL/uL (ref 3.87–5.11)
RDW: 13 % (ref 11.5–15.5)
WBC: 9.3 10*3/uL (ref 4.0–10.5)
nRBC: 0 % (ref 0.0–0.2)

## 2020-12-07 LAB — LIPASE, BLOOD: Lipase: 21 U/L (ref 11–51)

## 2020-12-07 LAB — COMPREHENSIVE METABOLIC PANEL
ALT: 75 U/L — ABNORMAL HIGH (ref 0–44)
AST: 49 U/L — ABNORMAL HIGH (ref 15–41)
Albumin: 4 g/dL (ref 3.5–5.0)
Alkaline Phosphatase: 77 U/L (ref 38–126)
Anion gap: 8 (ref 5–15)
BUN: 9 mg/dL (ref 6–20)
CO2: 24 mmol/L (ref 22–32)
Calcium: 8.9 mg/dL (ref 8.9–10.3)
Chloride: 106 mmol/L (ref 98–111)
Creatinine, Ser: 0.57 mg/dL (ref 0.44–1.00)
GFR, Estimated: >60 mL/min (ref >60)
Glucose, Bld: 105 mg/dL — ABNORMAL HIGH (ref 70–99)
Potassium: 3.8 mmol/L (ref 3.5–5.1)
Sodium: 138 mmol/L (ref 135–145)
Total Bilirubin: 0.5 mg/dL (ref 0.3–1.2)
Total Protein: 6.7 g/dL (ref 6.5–8.1)

## 2020-12-07 NOTE — ED Triage Notes (Signed)
Patient ambulatory to triage with steady gait, without difficulty or distress noted; pt reports lower abd pain accomp by nausea since yesterday

## 2020-12-08 ENCOUNTER — Emergency Department
Admission: EM | Admit: 2020-12-08 | Discharge: 2020-12-08 | Disposition: A | Discharge status: Home / Self Care | Payer: Self-pay | Attending: Emergency Medicine | Admitting: Emergency Medicine

## 2020-12-08 ENCOUNTER — Emergency Department: Payer: Self-pay

## 2020-12-08 DIAGNOSIS — R1030 Lower abdominal pain, unspecified: Secondary | ICD-10-CM

## 2020-12-08 DIAGNOSIS — N390 Urinary tract infection, site not specified: Secondary | ICD-10-CM

## 2020-12-08 DIAGNOSIS — R52 Pain, unspecified: Secondary | ICD-10-CM

## 2020-12-08 DIAGNOSIS — R102 Pelvic and perineal pain unspecified side: Secondary | ICD-10-CM

## 2020-12-08 LAB — URINALYSIS, COMPLETE (UACMP) WITH MICROSCOPIC
Bilirubin Urine: NEGATIVE
Glucose, UA: NEGATIVE mg/dL
Hgb urine dipstick: NEGATIVE
Ketones, ur: NEGATIVE mg/dL
Nitrite: NEGATIVE
Protein, ur: NEGATIVE mg/dL
Specific Gravity, Urine: 1.025 (ref 1.005–1.030)
pH: 5 (ref 5.0–8.0)

## 2020-12-08 LAB — POC URINE PREG, ED: Preg Test, Ur: NEGATIVE

## 2020-12-08 MED ORDER — FOSFOMYCIN TROMETHAMINE 3 G PO PACK
3.0000 g | PACK | Freq: Once | ORAL | Status: AC
  Administered 2020-12-08: 3 g via ORAL
  Filled 2020-12-08: qty 3

## 2020-12-08 MED ORDER — HYDROCODONE-ACETAMINOPHEN 5-325 MG PO TABS
1.0000 | ORAL_TABLET | Freq: Once | ORAL | Status: AC
  Administered 2020-12-08: 1 via ORAL
  Filled 2020-12-08: qty 1

## 2020-12-08 MED ORDER — NAPROXEN 500 MG PO TABS: 500.0000 mg | ORAL_TABLET | Freq: Two times a day (BID) | ORAL | 0 refills | Status: AC

## 2020-12-08 NOTE — ED Provider Notes (Signed)
[facility] Emergency Department Provider Note   ____________________________________________   Event Date/Time   First MD Initiated Contact with Patient 12/08/20 0345     (approximate)  I have reviewed the triage vital signs and the nursing notes.   HISTORY  Chief Complaint Abdominal Pain    HPI Shelby Chavez is a 25 y.o. female who presents to the ED from home with a chief complaint of lower abdominal pain and nausea since yesterday.  Last sexual intercourse yesterday after onset of pain.  Describes suprapubic to left adnexal pain.  Denies fever, cough, chest pain, shortness of breath, abdominal pain, vaginal bleeding/discharge, dysuria, diarrhea.  No STD concerns.     Past Medical History:  Diagnosis Date  . Medical history non-contributory     Patient Active Problem List   Diagnosis Date Noted  . Pregnancy 12/13/2017    Past Surgical History:  Procedure Laterality Date  . TONSILLECTOMY      Prior to Admission medications   Medication Sig Start Date End Date Taking? Authorizing Provider  naproxen (NAPROSYN) 500 MG tablet Take 1 tablet (500 mg total) by mouth 2 (two) times daily with a meal. 12/08/20  Yes Irean Hong, MD  Prenatal Vit-Fe Fumarate-FA (PRENATAL MULTIVITAMIN) TABS tablet Take 1 tablet by mouth daily at 12 noon.    [provider]    Allergies Patient has no known allergies.  No family history on file.  Social History Social History   Tobacco Use  . Smoking status: Never Smoker  . Smokeless tobacco: Never Used  Vaping Use  . Vaping Use: Never used  Substance Use Topics  . Alcohol use: No  . Drug use: No    Review of Systems  Constitutional: No fever/chills Eyes: No visual changes. ENT: No sore throat. Cardiovascular: Denies chest pain. Respiratory: Denies shortness of breath. Gastrointestinal: No abdominal pain.  Positive for pelvic pain and nausea, no vomiting.  No diarrhea.  No  constipation. Genitourinary: Negative for dysuria. Musculoskeletal: Negative for back pain. Skin: Negative for rash. Neurological: Negative for headaches, focal weakness or numbness.   ____________________________________________   PHYSICAL EXAM:  VITAL SIGNS: ED Triage Vitals  Enc Vitals Group     BP 12/07/20 2258 122/67     Pulse Rate 12/07/20 2258 72     Resp 12/07/20 2258 18     Temp 12/07/20 2258 98.5 F (36.9 C)     Temp Source 12/07/20 2258 Oral     SpO2 12/07/20 2258 97 %     Weight 12/07/20 2222 238 lb (108 kg)     Height 12/07/20 2222 5\' 6"  (1.676 m)     Head Circumference --      Peak Flow --      Pain Score 12/07/20 2222 8     Pain Loc --      Pain Edu? --      Excl. in GC? --     Constitutional: Alert and oriented. Well appearing and in no acute distress. Eyes: Conjunctivae are normal. PERRL. EOMI. Head: Atraumatic. Nose: No congestion/rhinnorhea. Mouth/Throat: Mucous membranes are moist.  Oropharynx non-erythematous. Neck: No stridor.   Cardiovascular: Normal rate, regular rhythm. Grossly normal heart sounds.  Good peripheral circulation. Respiratory: Normal respiratory effort.  No retractions. Lungs CTAB. Gastrointestinal: Soft and mildly tender to left adnexa without rebound or guard. No distention. No abdominal bruits. No CVA tenderness. Musculoskeletal: No lower extremity tenderness nor edema.  No joint effusions. Neurologic:  Normal speech and language.  No gross focal neurologic deficits are appreciated. No gait instability. Skin:  Skin is warm, dry and intact. No rash noted. Psychiatric: Mood and affect are normal. Speech and behavior are normal.  ____________________________________________   LABS (all labs ordered are listed, but only abnormal results are displayed)  Labs Reviewed  CBC WITH DIFFERENTIAL/PLATELET - Abnormal; Notable for the following components:      Result Value   Eosinophils Absolute 1.2 (*)    All other components within  normal limits  COMPREHENSIVE METABOLIC PANEL - Abnormal; Notable for the following components:   Glucose, Bld 105 (*)    AST 49 (*)    ALT 75 (*)    All other components within normal limits  URINALYSIS, COMPLETE (UACMP) WITH MICROSCOPIC - Abnormal; Notable for the following components:   Color, Urine YELLOW (*)    APPearance HAZY (*)    Leukocytes,Ua SMALL (*)    Bacteria, UA RARE (*)    All other components within normal limits  LIPASE, BLOOD  POC URINE PREG, ED   ____________________________________________  EKG  None ____________________________________________  RADIOLOGY I, Tasheba Henson J, personally viewed and evaluated these images (plain radiographs) as part of my medical decision making, as well as reviewing the written report by the radiologist.  ED MD interpretation: Unremarkable pelvic ultrasound  Official radiology report(s): US PELVIC COMPLETE W TRANSVAGINAL AND TORSION R/O  Result Date: 12/08/2020 CLINICAL DATA:  Lower abdominal pain since yesterday. History of C-section. Last menstrual period 11/18/2020. EXAM: TRANSABDOMINAL AND TRANSVAGINAL ULTRASOUND OF PELVIS DOPPLER ULTRASOUND OF OVARIES TECHNIQUE: Both transabdominal and transvaginal ultrasound examinations of the pelvis were performed. Transabdominal technique was performed for global imaging of the pelvis including uterus, ovaries, adnexal regions, and pelvic cul-de-sac. It was necessary to proceed with endovaginal exam following the transabdominal exam to visualize the endometrium and bilateral ovaries. Color and duplex Doppler ultrasound was utilized to evaluate blood flow to the ovaries. COMPARISON:  CT abdomen pelvis 05/25/2017 FINDINGS: Uterus Measurements: 8.2 x 4.6 x 5.8 cm = volume: 115 mL. No fibroids or other mass visualized. Endometrium Thickness: 10 mm.  No focal abnormality visualized. Right ovary Measurements: 3.6 x 2.6 x 2.4 cm = volume: 11.9 mL. Normal appearance/no adnexal mass. Left ovary  Measurements: 3.6 x 2.8 x 2.7 cm = volume: 14.2 mL. Normal appearance/no adnexal mass. Pulsed Doppler evaluation of both ovaries demonstrates normal low-resistance arterial and venous waveforms. Other findings Trace free fluid within the pelvis. IMPRESSION: Unremarkable pelvic ultrasound. Electronically Signed   By: Tish Frederickson M.D.   On: 12/08/2020 05:38    ____________________________________________   PROCEDURES  Procedure(s) performed (including Critical Care):  Procedures   ____________________________________________   INITIAL IMPRESSION / ASSESSMENT AND PLAN / ED COURSE  As part of my medical decision making, I reviewed the following data within the electronic MEDICAL RECORD NUMBER Nursing notes reviewed and incorporated, Labs reviewed, Old chart reviewed, Radiograph reviewed and Notes from prior ED visits     25 year old female presenting with suprapubic and left adnexal pain. Differential diagnosis includes, but is not limited to, ovarian cyst, ovarian torsion, acute appendicitis, diverticulitis, urinary tract infection/pyelonephritis, endometriosis, bowel obstruction, colitis, renal colic, gastroenteritis, hernia, fibroids, endometriosis, pregnancy related pain including ectopic pregnancy, etc.  Laboratory results unremarkable other than small leukocytes in urine.  Will obtain pelvic ultrasound.  Administer Norco for discomfort.  Clinical Course as of 12/08/20 0645  Thu Dec 08, 2020  0616 Patient resting in NAD. No right lower quadrant tenderness on re-examination. Updated her of all test  results. Will dose fosfomycin prior to discharge. Strict return precautions given. Patient verbalizes understanding agrees with plan of care. [JS]    Clinical Course User Index [JS] Irean Hong, MD     ____________________________________________   FINAL CLINICAL IMPRESSION(S) / ED DIAGNOSES  Final diagnoses:  Pain  Lower urinary tract infectious disease  Pelvic pain in female      ED Discharge Orders         Ordered    naproxen (NAPROSYN) 500 MG tablet  2 times daily with meals        12/08/20 0618          *Please note:  Ilea A Spratling was evaluated in Emergency Department on 12/08/2020 for the symptoms described in the history of present illness. She was evaluated in the context of the global COVID-19 pandemic, which necessitated consideration that the patient might be at risk for infection with the SARS-CoV-2 virus that causes COVID-19. Institutional protocols and algorithms that pertain to the evaluation of patients at risk for COVID-19 are in a state of rapid change based on information released by regulatory bodies including the CDC and federal and state organizations. These policies and algorithms were followed during the patient's care in the ED.  Some ED evaluations and interventions may be delayed as a result of limited staffing during and the pandemic.*   Note:  This document was prepared using Dragon voice recognition software and may include unintentional dictation errors.   Irean Hong, MD 12/08/20 [phone]

## 2020-12-08 NOTE — Discharge Instructions (Signed)
You may take Naprosyn twice daily as needed for pain.  Return to the ER for worsening symptoms, persistent vomiting, difficulty breathing, fever or other concerns.
